# Patient Record
Sex: Male | Born: 2000 | Hispanic: No | Marital: Single | Smoking: Never smoker
Health system: Southern US, Community
[De-identification: ages and names within clinical notes are randomized; demographics above are authoritative.]

---
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
## 2018-09-11 ENCOUNTER — Emergency Department (HOSPITAL_COMMUNITY): Payer: 59

## 2018-09-11 ENCOUNTER — Encounter (HOSPITAL_COMMUNITY): Payer: Self-pay | Admitting: Emergency Medicine

## 2018-09-11 ENCOUNTER — Other Ambulatory Visit: Payer: Self-pay

## 2018-09-11 ENCOUNTER — Inpatient Hospital Stay (HOSPITAL_COMMUNITY)
Admission: EM | Admit: 2018-09-11 | Discharge: 2018-09-12 | DRG: 206 | Disposition: A | Discharge status: Home / Self Care | Payer: 59 | Attending: Surgery | Admitting: Surgery

## 2018-09-11 DIAGNOSIS — S42102A Fracture of unspecified part of scapula, left shoulder, initial encounter for closed fracture: Secondary | ICD-10-CM | POA: Diagnosis not present

## 2018-09-11 DIAGNOSIS — S42109A Fracture of unspecified part of scapula, unspecified shoulder, initial encounter for closed fracture: Secondary | ICD-10-CM

## 2018-09-11 DIAGNOSIS — S27329A Contusion of lung, unspecified, initial encounter: Secondary | ICD-10-CM

## 2018-09-11 DIAGNOSIS — S27321A Contusion of lung, unilateral, initial encounter: Principal | ICD-10-CM | POA: Diagnosis present

## 2018-09-11 DIAGNOSIS — S42111A Displaced fracture of body of scapula, right shoulder, initial encounter for closed fracture: Secondary | ICD-10-CM | POA: Diagnosis present

## 2018-09-11 DIAGNOSIS — S42022A Displaced fracture of shaft of left clavicle, initial encounter for closed fracture: Secondary | ICD-10-CM | POA: Diagnosis not present

## 2018-09-11 DIAGNOSIS — S42112A Displaced fracture of body of scapula, left shoulder, initial encounter for closed fracture: Secondary | ICD-10-CM | POA: Diagnosis present

## 2018-09-11 DIAGNOSIS — Y92410 Unspecified street and highway as the place of occurrence of the external cause: Secondary | ICD-10-CM

## 2018-09-11 DIAGNOSIS — S42101A Fracture of unspecified part of scapula, right shoulder, initial encounter for closed fracture: Secondary | ICD-10-CM | POA: Diagnosis not present

## 2018-09-11 DIAGNOSIS — Z1159 Encounter for screening for other viral diseases: Secondary | ICD-10-CM

## 2018-09-11 LAB — COMPREHENSIVE METABOLIC PANEL
ALT: 105 U/L — ABNORMAL HIGH (ref 0–44)
AST: 209 U/L — ABNORMAL HIGH (ref 15–41)
Albumin: 4.9 g/dL (ref 3.5–5.0)
Alkaline Phosphatase: 83 U/L (ref 38–126)
Anion gap: 12 (ref 5–15)
BUN: 17 mg/dL (ref 6–20)
CO2: 22 mmol/L (ref 22–32)
Calcium: 9.7 mg/dL (ref 8.9–10.3)
Chloride: 105 mmol/L (ref 98–111)
Creatinine, Ser: 1.02 mg/dL (ref 0.61–1.24)
GFR calc Af Amer: >60 mL/min (ref >60)
GFR calc non Af Amer: >60 mL/min (ref >60)
Glucose, Bld: 112 mg/dL — ABNORMAL HIGH (ref 70–99)
Potassium: 4.5 mmol/L (ref 3.5–5.1)
Sodium: 139 mmol/L (ref 135–145)
Total Bilirubin: 1.1 mg/dL (ref 0.3–1.2)
Total Protein: 7.1 g/dL (ref 6.5–8.1)

## 2018-09-11 LAB — CBC
HCT: 44.7 % (ref 39.0–52.0)
Hemoglobin: 14.9 g/dL (ref 13.0–17.0)
MCH: 31.6 pg (ref 26.0–34.0)
MCHC: 33.3 g/dL (ref 30.0–36.0)
MCV: 94.7 fL (ref 80.0–100.0)
Platelets: 275 10*3/uL (ref 150–400)
RBC: 4.72 MIL/uL (ref 4.22–5.81)
RDW: 11.8 % (ref 11.5–15.5)
WBC: 21.8 10*3/uL — ABNORMAL HIGH (ref 4.0–10.5)
nRBC: 0 % (ref 0.0–0.2)

## 2018-09-11 LAB — I-STAT CHEM 8, ED
BUN: 20 mg/dL (ref 6–20)
Calcium, Ion: 1.06 mmol/L — ABNORMAL LOW (ref 1.15–1.40)
Chloride: 105 mmol/L (ref 98–111)
Creatinine, Ser: 1 mg/dL (ref 0.61–1.24)
Glucose, Bld: 113 mg/dL — ABNORMAL HIGH (ref 70–99)
HCT: 45 % (ref 39.0–52.0)
Hemoglobin: 15.3 g/dL (ref 13.0–17.0)
Potassium: 3.8 mmol/L (ref 3.5–5.1)
Sodium: 138 mmol/L (ref 135–145)
TCO2: 25 mmol/L (ref 22–32)

## 2018-09-11 LAB — SAMPLE TO BLOOD BANK

## 2018-09-11 LAB — CDS SEROLOGY

## 2018-09-11 LAB — PROTIME-INR
INR: 1.5 — ABNORMAL HIGH (ref 0.8–1.2)
Prothrombin Time: 18.2 seconds — ABNORMAL HIGH (ref 11.4–15.2)

## 2018-09-11 LAB — ETHANOL: Alcohol, Ethyl (B): <10 mg/dL (ref <10)

## 2018-09-11 LAB — LACTIC ACID, PLASMA: Lactic Acid, Venous: 1.7 mmol/L (ref 0.5–1.9)

## 2018-09-11 MED ORDER — HYDROMORPHONE HCL 1 MG/ML IJ SOLN
0.5000 mg | INTRAMUSCULAR | Status: DC | PRN
  Administered 2018-09-12: 0.5 mg via INTRAVENOUS
  Filled 2018-09-11: qty 1

## 2018-09-11 MED ORDER — OXYCODONE HCL 5 MG PO TABS
5.0000 mg | ORAL_TABLET | Freq: Four times a day (QID) | ORAL | Status: DC | PRN
  Administered 2018-09-12 (×3): 5 mg via ORAL
  Filled 2018-09-11 (×3): qty 1

## 2018-09-11 MED ORDER — IBUPROFEN 200 MG PO TABS
600.0000 mg | ORAL_TABLET | Freq: Four times a day (QID) | ORAL | Status: DC | PRN
  Administered 2018-09-12: 17:00:00 600 mg via ORAL
  Filled 2018-09-11: qty 3

## 2018-09-11 MED ORDER — MORPHINE SULFATE (PF) 4 MG/ML IV SOLN
4.0000 mg | Freq: Once | INTRAVENOUS | Status: AC
  Administered 2018-09-11: 4 mg via INTRAVENOUS
  Filled 2018-09-11: qty 1

## 2018-09-11 MED ORDER — IOHEXOL 300 MG/ML  SOLN
100.0000 mL | Freq: Once | INTRAMUSCULAR | Status: AC | PRN
  Administered 2018-09-11: 100 mL via INTRAVENOUS

## 2018-09-11 MED ORDER — LACTATED RINGERS IV SOLN
INTRAVENOUS | Status: DC
  Administered 2018-09-12: via INTRAVENOUS

## 2018-09-11 MED ORDER — HYDRALAZINE HCL 20 MG/ML IJ SOLN: 10.0000 mg | INTRAMUSCULAR | Status: DC | PRN

## 2018-09-11 MED ORDER — ACETAMINOPHEN 325 MG PO TABS
650.0000 mg | ORAL_TABLET | Freq: Four times a day (QID) | ORAL | Status: DC
  Administered 2018-09-12 (×4): 650 mg via ORAL
  Filled 2018-09-11 (×4): qty 2

## 2018-09-11 MED ORDER — DOCUSATE SODIUM 100 MG PO CAPS: 100.0000 mg | ORAL_CAPSULE | Freq: Two times a day (BID) | ORAL | Status: DC

## 2018-09-11 MED ORDER — ONDANSETRON 4 MG PO TBDP: 4.0000 mg | ORAL_TABLET | Freq: Four times a day (QID) | ORAL | Status: DC | PRN

## 2018-09-11 MED ORDER — TETANUS-DIPHTH-ACELL PERTUSSIS 5-2.5-18.5 LF-MCG/0.5 IM SUSP
0.5000 mL | Freq: Once | INTRAMUSCULAR | Status: DC
  Filled 2018-09-11: qty 0.5

## 2018-09-11 MED ORDER — ONDANSETRON HCL 4 MG/2ML IJ SOLN: 4.0000 mg | Freq: Four times a day (QID) | INTRAMUSCULAR | Status: DC | PRN
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
## 2018-09-11 NOTE — ED Notes (Signed)
[place] pts mother would like a current update

## 2018-09-11 NOTE — ED Notes (Signed)
Pt's mother updated

## 2018-09-11 NOTE — ED Notes (Signed)
[place] pts mother wants an update soon as possible
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
## 2018-09-12 ENCOUNTER — Inpatient Hospital Stay (HOSPITAL_COMMUNITY): Payer: 59

## 2018-09-12 DIAGNOSIS — S42101A Fracture of unspecified part of scapula, right shoulder, initial encounter for closed fracture: Secondary | ICD-10-CM

## 2018-09-12 DIAGNOSIS — S42102A Fracture of unspecified part of scapula, left shoulder, initial encounter for closed fracture: Secondary | ICD-10-CM

## 2018-09-12 DIAGNOSIS — S42022A Displaced fracture of shaft of left clavicle, initial encounter for closed fracture: Secondary | ICD-10-CM

## 2018-09-12 LAB — CBC
HCT: 38.9 % — ABNORMAL LOW (ref 39.0–52.0)
Hemoglobin: 13.4 g/dL (ref 13.0–17.0)
MCH: 32.1 pg (ref 26.0–34.0)
MCHC: 34.4 g/dL (ref 30.0–36.0)
MCV: 93.1 fL (ref 80.0–100.0)
Platelets: 218 10*3/uL (ref 150–400)
RBC: 4.18 MIL/uL — ABNORMAL LOW (ref 4.22–5.81)
RDW: 12 % (ref 11.5–15.5)
WBC: 9.1 10*3/uL (ref 4.0–10.5)
nRBC: 0 % (ref 0.0–0.2)

## 2018-09-12 LAB — HIV ANTIBODY (ROUTINE TESTING W REFLEX): HIV Screen 4th Generation wRfx: NONREACTIVE

## 2018-09-12 LAB — MRSA PCR SCREENING: MRSA by PCR: NEGATIVE

## 2018-09-12 MED ORDER — OXYCODONE HCL 5 MG PO TABS: 5.0000 mg | ORAL_TABLET | Freq: Four times a day (QID) | ORAL | 0 refills | Status: DC | PRN

## 2018-09-12 MED ORDER — ACETAMINOPHEN 500 MG PO TABS: 1000.0000 mg | ORAL_TABLET | Freq: Four times a day (QID) | ORAL | Status: AC

## 2018-09-12 MED ORDER — IBUPROFEN 600 MG PO TABS: 600.0000 mg | ORAL_TABLET | Freq: Three times a day (TID) | ORAL | 0 refills | Status: AC | PRN
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
## 2018-09-12 NOTE — Progress Notes (Signed)
Pt given d/c education and all questions answered. No printed prescriptions to give or equipment to deliver. IV removed. Pt taken to car with all belongings. RN gave D/C instructions to mother including medication education, OT recommended exercises, and the information about scheduling a follow up appointment with ortho.

## 2018-09-12 NOTE — Progress Notes (Signed)
Received to room 4N06 from ER via stretcher. Assisted to bed and positioned for comfort. Oriented to room, bed and unit. Admission assessment completed.
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
## 2018-09-12 NOTE — Discharge Instructions (Signed)
As directed by Orthopedics.  Sling to left arm.  No lifting

## 2018-09-13 LAB — NOVEL CORONAVIRUS, NAA (HOSP ORDER, SEND-OUT TO REF LAB; TAT 18-24 HRS): SARS-CoV-2, NAA: NOT DETECTED

## 2018-09-17 ENCOUNTER — Other Ambulatory Visit: Payer: Self-pay

## 2018-09-17 ENCOUNTER — Encounter: Payer: Self-pay | Admitting: Orthopedic Surgery

## 2018-09-17 ENCOUNTER — Ambulatory Visit: Payer: Self-pay

## 2018-09-17 ENCOUNTER — Ambulatory Visit (INDEPENDENT_AMBULATORY_CARE_PROVIDER_SITE_OTHER): Payer: 59 | Admitting: Orthopedic Surgery

## 2018-09-17 DIAGNOSIS — S42022D Displaced fracture of shaft of left clavicle, subsequent encounter for fracture with routine healing: Secondary | ICD-10-CM

## 2018-09-17 DIAGNOSIS — M898X1 Other specified disorders of bone, shoulder: Secondary | ICD-10-CM

## 2018-09-17 MED ORDER — OXYCODONE HCL 5 MG PO TABS: 5.0000 mg | ORAL_TABLET | Freq: Three times a day (TID) | ORAL | 0 refills | Status: DC

## 2018-09-18 ENCOUNTER — Encounter: Payer: Self-pay | Admitting: Orthopedic Surgery
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
## 2018-10-08 ENCOUNTER — Ambulatory Visit (INDEPENDENT_AMBULATORY_CARE_PROVIDER_SITE_OTHER): Payer: 59 | Admitting: Orthopedic Surgery

## 2018-10-08 ENCOUNTER — Encounter: Payer: Self-pay | Admitting: Orthopedic Surgery

## 2018-10-08 ENCOUNTER — Other Ambulatory Visit: Payer: Self-pay

## 2018-10-08 ENCOUNTER — Ambulatory Visit (INDEPENDENT_AMBULATORY_CARE_PROVIDER_SITE_OTHER): Payer: 59

## 2018-10-08 DIAGNOSIS — S42022D Displaced fracture of shaft of left clavicle, subsequent encounter for fracture with routine healing: Secondary | ICD-10-CM

## 2018-10-08 MED ORDER — TRAMADOL HCL 50 MG PO TABS: 50.0000 mg | ORAL_TABLET | Freq: Three times a day (TID) | ORAL | 0 refills | Status: DC | PRN
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
## 2018-10-17 ENCOUNTER — Other Ambulatory Visit: Payer: Self-pay | Admitting: Orthopedic Surgery

## 2018-10-17 NOTE — Telephone Encounter (Signed)
Ok to rf? 

## 2018-10-20 NOTE — Telephone Encounter (Signed)
y

## 2018-10-20 NOTE — Telephone Encounter (Signed)
Can you please submit this electronically? 

## 2018-10-29 ENCOUNTER — Ambulatory Visit (INDEPENDENT_AMBULATORY_CARE_PROVIDER_SITE_OTHER): Payer: 59 | Admitting: Orthopedic Surgery

## 2018-10-29 ENCOUNTER — Encounter: Payer: Self-pay | Admitting: Orthopedic Surgery

## 2018-10-29 DIAGNOSIS — S42022D Displaced fracture of shaft of left clavicle, subsequent encounter for fracture with routine healing: Secondary | ICD-10-CM
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
## 2018-12-25 ENCOUNTER — Ambulatory Visit: Payer: 59 | Admitting: Orthopedic Surgery

## 2019-07-29 ENCOUNTER — Ambulatory Visit
Admission: EM | Admit: 2019-07-29 | Discharge: 2019-07-29 | Disposition: A | Discharge status: Home / Self Care | Payer: Commercial Managed Care - PPO | Attending: Physician Assistant | Admitting: Physician Assistant

## 2019-07-29 ENCOUNTER — Encounter: Payer: Self-pay | Admitting: Emergency Medicine

## 2019-07-29 ENCOUNTER — Telehealth: Payer: Self-pay | Admitting: Emergency Medicine

## 2019-07-29 ENCOUNTER — Other Ambulatory Visit: Payer: Self-pay

## 2019-07-29 DIAGNOSIS — G8929 Other chronic pain: Secondary | ICD-10-CM | POA: Diagnosis not present

## 2019-07-29 DIAGNOSIS — M25512 Pain in left shoulder: Secondary | ICD-10-CM | POA: Diagnosis not present

## 2019-07-29 MED ORDER — METHOCARBAMOL 500 MG PO TABS: 500.0000 mg | ORAL_TABLET | Freq: Two times a day (BID) | ORAL | 0 refills | Status: DC

## 2019-07-29 MED ORDER — METHOCARBAMOL 500 MG PO TABS: 500.0000 mg | ORAL_TABLET | Freq: Two times a day (BID) | ORAL | 0 refills | Status: AC

## 2019-07-29 MED ORDER — DICLOFENAC SODIUM 50 MG PO TBEC: 50.0000 mg | DELAYED_RELEASE_TABLET | Freq: Two times a day (BID) | ORAL | 0 refills | Status: AC

## 2019-07-29 MED ORDER — DICLOFENAC SODIUM 50 MG PO TBEC: 50.0000 mg | DELAYED_RELEASE_TABLET | Freq: Two times a day (BID) | ORAL | 0 refills | Status: DC
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
## 2019-07-29 NOTE — ED Triage Notes (Signed)
Patient changed his mind about pharmacy, resent medications

## 2019-07-29 NOTE — Discharge Instructions (Signed)
Start diclofenac. Do not take ibuprofen (motrin/advil)/ naproxen (aleve) while on diclofenac. Robaxin as needed, this can make you drowsy, so do not take if you are going to drive, operate heavy machinery, or make important decisions. Ice/heat compresses as needed.  Follow up with sports medicine if symptoms not improving.

## 2019-07-30 ENCOUNTER — Ambulatory Visit (INDEPENDENT_AMBULATORY_CARE_PROVIDER_SITE_OTHER): Payer: Commercial Managed Care - PPO | Admitting: Sports Medicine

## 2019-07-30 ENCOUNTER — Encounter: Payer: Self-pay | Admitting: Sports Medicine

## 2019-07-30 VITALS — BP 110/70 | Ht 66.0 in | Wt 135.0 lb

## 2019-07-30 DIAGNOSIS — M7552 Bursitis of left shoulder: Secondary | ICD-10-CM

## 2019-07-30 MED ORDER — MELOXICAM 15 MG PO TABS: 15.0000 mg | ORAL_TABLET | Freq: Every day | ORAL | 0 refills | Status: AC

## 2019-07-30 MED ORDER — METHYLPREDNISOLONE ACETATE 40 MG/ML IJ SUSP
40.0000 mg | Freq: Once | INTRAMUSCULAR | Status: AC
  Administered 2019-07-30: 40 mg via INTRA_ARTICULAR

## 2019-07-30 NOTE — Progress Notes (Signed)
  CHIOKE NOXON - 19 y.o. male MRN [id number]  Date of birth: 2001/01/11  SUBJECTIVE:   CC: left shoulder pain   19 year old left-handed male presenting with left shoulder pain for the past 2 months.  He reports that he has had gradually worsening pain with over head activities.  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  He denies an acute injury or a specific incident when his shoulder started hurting.  He reports that pain is in the lateral shoulder as well as trapezius area.  He does not have any weakness, numbness, or tingling.  He presented to urgent care last night due to pain and was given diclofenac and methocarbamol.  He has taken several of each that has not helped the pain but did cause abdominal pain and stomach upset.  No prior injury to his shoulder.  ROS: No unexpected weight loss, fever, chills, swelling, instability, muscle pain, numbness/tingling, redness, otherwise see HPI   PMHx - Updated and reviewed.  Contributory factors include: Negative PSHx - Updated and reviewed.  Contributory factors include:  Negative FHx - Updated and reviewed.  Contributory factors include:  Negative Social Hx - Updated and reviewed. Contributory factors include: Negative Medications - reviewed   DATA REVIEWED: Urgent care records  PHYSICAL EXAM:  VS: BP:110/70  HR: bpm  TEMP: ( )  RESP:   HT:5\' 6"  (167.6 cm)   WT:135 lb (61.2 kg)  BMI:21.8 PHYSICAL EXAM: Gen: NAD, alert, cooperative with exam, well-appearing HEENT: clear conjunctiva,  CV:  no edema, capillary refill brisk, normal rate Resp: non-labored Skin: no rashes, normal turgor  Neuro: no gross deficits.  Psych:  alert and oriented  Left Shoulder: Inspection reveals no obvious deformity, atrophy, or asymmetry. No bruising. No swelling TTP over lateral shoulder. Trigger point at lateral left trapezius muscle Full ROM in flexion, abduction,  internal/external rotation NV intact distally Normal scapular function observed. Special Tests:  - Impingement: Neg Hawkins, neers, empty can sign. - Supraspinatous: positive empty can.  5/5 strength with resisted flexion at 20 degrees- some pain - Infraspinatous/Teres Minor: 5/5 strength with ER - Subscapularis: negative belly press, negative bear hug. 5/5 strength with IR - Biceps tendon: Negative Speeds, Yerrgason's  - Labrum: Negative Obriens, negative clunk, good stability - AC Joint: Negative cross arm   ASSESSMENT & PLAN:  19 year old male presenting with 2 months of left shoulder pain with clinical signs of subacromial bursitis/ rotator cuff tendinopathy.  No history of trauma to indicate labral or rotator cuff tear.  Exam is reassuring.  Discussed various options and patient elected for corticosteroid injection today for pain relief.  Provided rotator cuff rehabilitation exercises.  Patient does not want to use Voltaren anymore and would prefer a different medicine.  Will trial meloxicam once daily as needed.  Return as needed.  Procedure performed: subacromial corticosteroid injection; palpation guided  Consent obtained and verified. Time-out conducted. Noted no overlying erythema, induration, or other signs of local infection. The left posterior subacromial space was palpated and marked. The overlying skin was prepped in a sterile fashion. Topical analgesic spray: Ethyl chloride. Joint: left subacromial Needle: 25 gauge, 1.5 inch Completed without difficulty. Meds: 3 cc 1% lidocaine without epinephrine, 40 mg depo-medrol   Advised to call if fevers/chills, erythema, induration, drainage, or persistent bleeding.  I agree with the above plan of care. Marsa Aris, DO
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
## 2020-01-11 DEATH — deceased
